# Patient Record
Sex: Female | Born: 1980 | Race: White | Hispanic: No | Marital: Married | Smoking: Former smoker
Health system: Southern US, Community
[De-identification: ages and names within clinical notes are randomized; demographics above are authoritative.]

## PROBLEM LIST (undated history)

## (undated) DIAGNOSIS — Z789 Other specified health status: Secondary | ICD-10-CM

## (undated) HISTORY — PX: TONSILLECTOMY: SUR1361

---
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
## 2002-02-13 ENCOUNTER — Encounter (INDEPENDENT_AMBULATORY_CARE_PROVIDER_SITE_OTHER): Payer: Self-pay | Admitting: *Deleted

## 2002-02-13 ENCOUNTER — Ambulatory Visit (HOSPITAL_BASED_OUTPATIENT_CLINIC_OR_DEPARTMENT_OTHER): Admission: RE | Admit: 2002-02-13 | Discharge: 2002-02-13 | Payer: Self-pay | Admitting: Surgery

## 2003-02-15 ENCOUNTER — Encounter: Payer: Self-pay | Admitting: Obstetrics & Gynecology

## 2003-02-15 ENCOUNTER — Encounter: Admission: RE | Admit: 2003-02-15 | Discharge: 2003-02-15 | Payer: Self-pay | Admitting: Obstetrics & Gynecology

## 2003-08-13 ENCOUNTER — Other Ambulatory Visit: Admission: RE | Admit: 2003-08-13 | Discharge: 2003-08-13 | Payer: Self-pay | Admitting: Obstetrics & Gynecology

## 2004-10-13 ENCOUNTER — Ambulatory Visit: Payer: Self-pay | Admitting: Internal Medicine

## 2005-02-22 ENCOUNTER — Other Ambulatory Visit: Admission: RE | Admit: 2005-02-22 | Discharge: 2005-02-22 | Payer: Self-pay | Admitting: Obstetrics & Gynecology

## 2005-05-21 ENCOUNTER — Ambulatory Visit: Payer: Self-pay | Admitting: Family Medicine

## 2005-05-31 ENCOUNTER — Ambulatory Visit: Payer: Self-pay | Admitting: Internal Medicine

## 2005-08-20 ENCOUNTER — Ambulatory Visit: Payer: Self-pay | Admitting: Internal Medicine

## 2005-09-24 ENCOUNTER — Ambulatory Visit: Payer: Self-pay | Admitting: Internal Medicine

## 2005-12-27 ENCOUNTER — Ambulatory Visit: Payer: Self-pay | Admitting: Internal Medicine

## 2006-08-25 ENCOUNTER — Ambulatory Visit: Payer: Self-pay | Admitting: Internal Medicine

## 2006-09-27 ENCOUNTER — Ambulatory Visit: Payer: Self-pay | Admitting: Internal Medicine

## 2006-10-04 ENCOUNTER — Ambulatory Visit: Payer: Self-pay | Admitting: Internal Medicine

## 2007-01-20 ENCOUNTER — Ambulatory Visit: Payer: Self-pay | Admitting: Internal Medicine

## 2007-01-31 ENCOUNTER — Encounter: Admission: RE | Admit: 2007-01-31 | Discharge: 2007-01-31 | Payer: Self-pay | Admitting: Gastroenterology

## 2007-01-31 IMAGING — RF DG BE W/ CM - WO/W KUB
17 of 22 series · 17 of 22 positions shown · non-contrast
Comparison: none

CLINICAL DATA: Chronic diarrhea. Reflux terminal ileum to evaluate for Crohn
disease.

SINGLE-COLUMN BARIUM ENEMA:
TECHNIQUE: After obtaining a scout radiograph, a single-column barium enema was
performed under fluoroscopy.

[Series 1: run · 1 of 1 slices shown (1 of 15)]
[im 1/1]
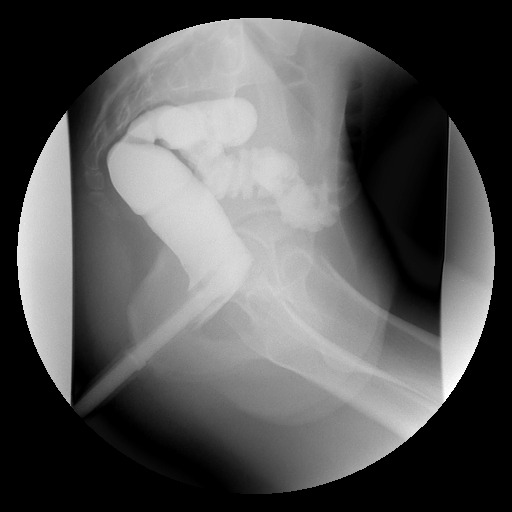

[Series 2: run · 1 of 1 slices shown (2 of 15)]
[im 1/1]
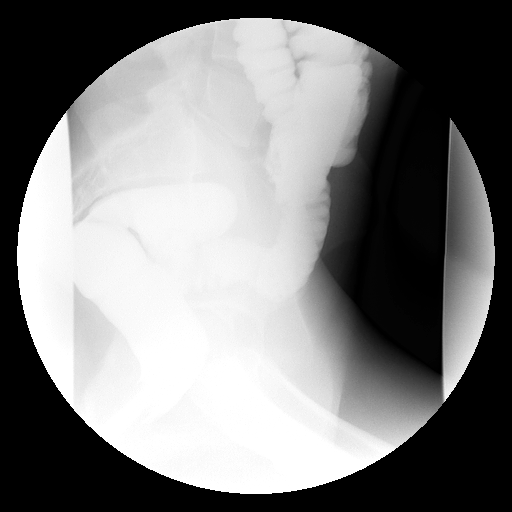

[Series 4: run · 1 of 1 slices shown (3 of 15)]
[im 1/1]
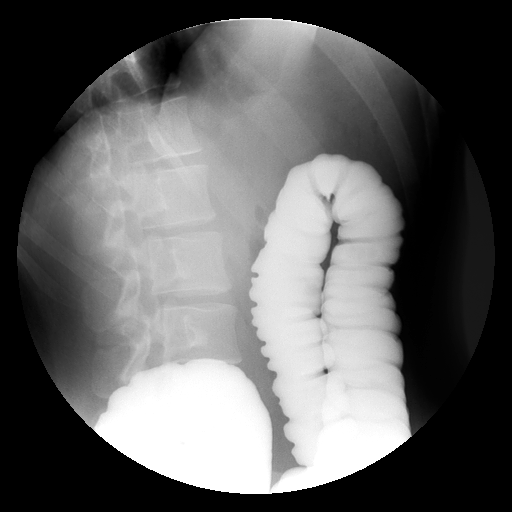

[Series 5: run · 1 of 1 slices shown (4 of 15)]
[im 1/1]
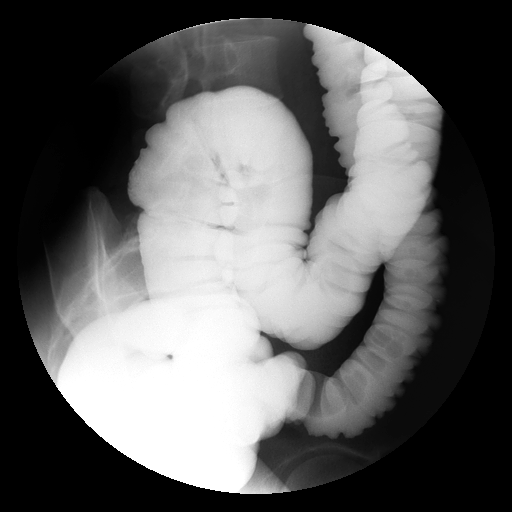

[Series 6: run · 1 of 1 slices shown (5 of 15)]
[im 1/1]
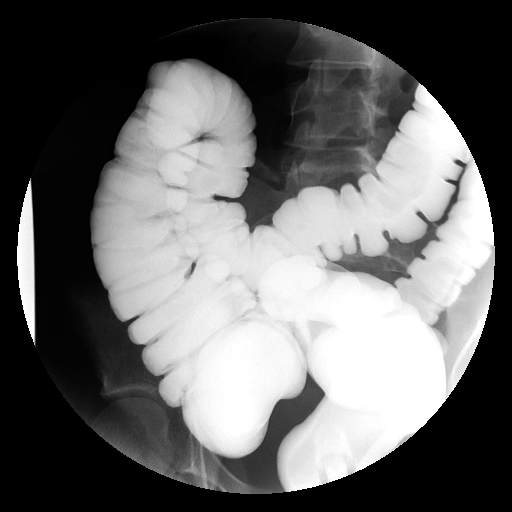

[Series 8: run · 1 of 1 slices shown (6 of 15)]
[im 1/1]
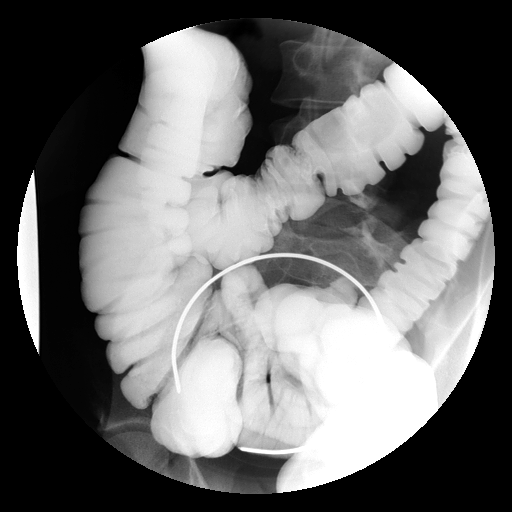

[Series 9: run · 1 of 1 slices shown (7 of 15)]
[im 1/1]
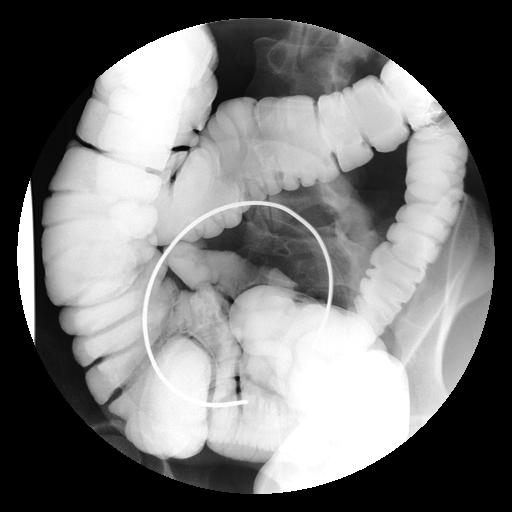

[Series 10: run · 1 of 1 slices shown (8 of 15)]
[im 1/1]
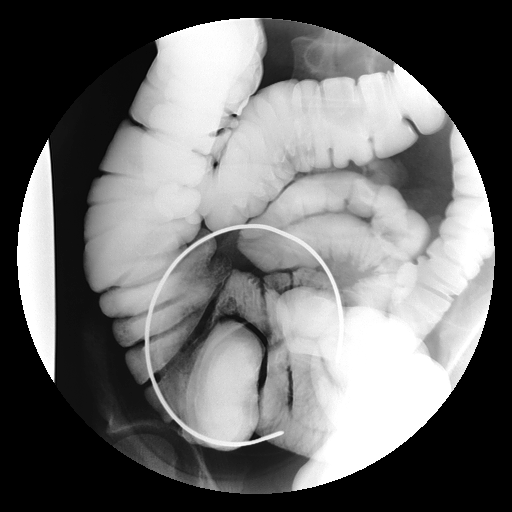

[Series 12: run · 1 of 1 slices shown (9 of 15)]
[im 1/1]
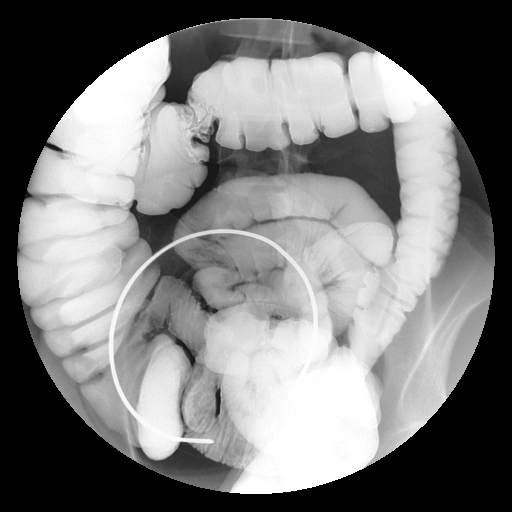

[Series 13: run · 1 of 1 slices shown (10 of 15)]
[im 1/1]
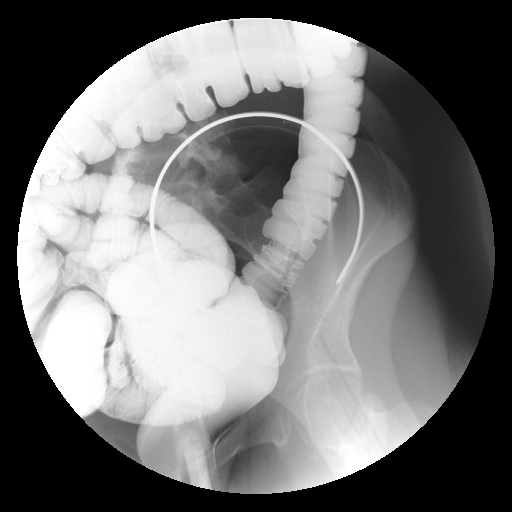

[Series 14: run · 1 of 1 slices shown (11 of 15)]
[im 1/1]
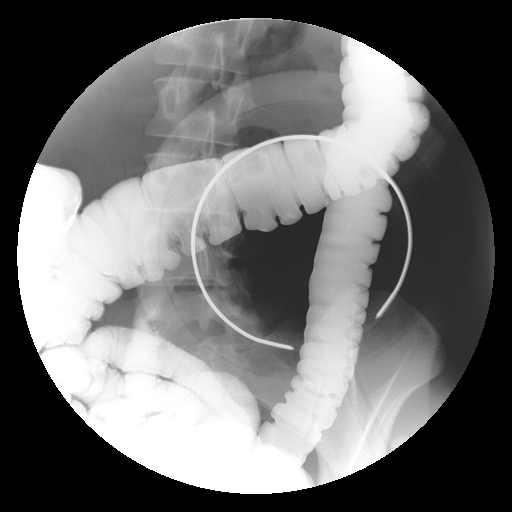

[Series 15: run · 1 of 1 slices shown (12 of 15)]
[im 1/1]
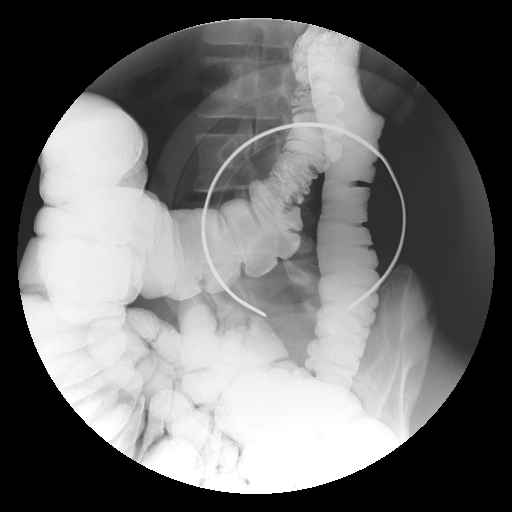

[Series 17: run · 1 of 1 slices shown (13 of 15)]
[im 1/1]
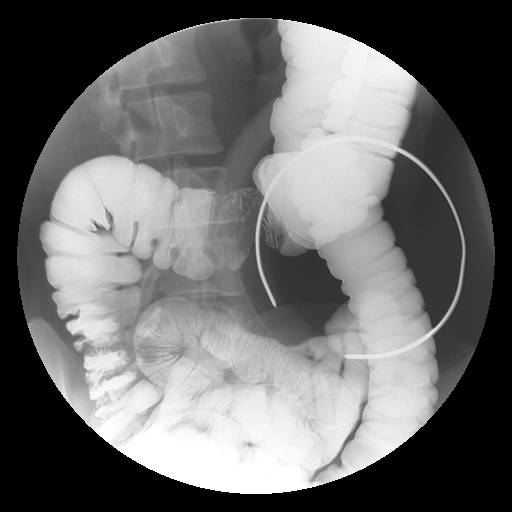

[Series 18: run · 1 of 1 slices shown (14 of 15)]
[im 1/1]
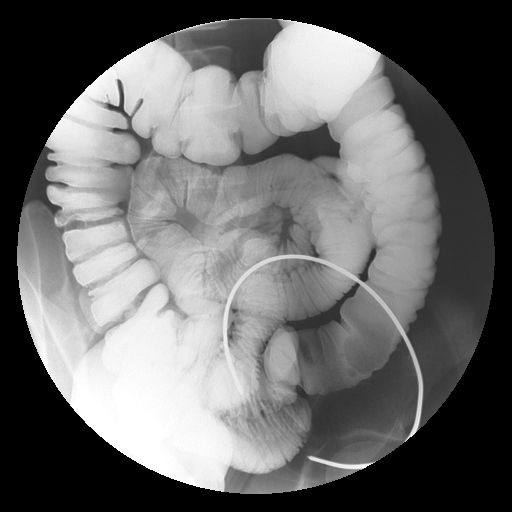

[Series 19: run · 1 of 1 slices shown (15 of 15)]
[im 1/1]
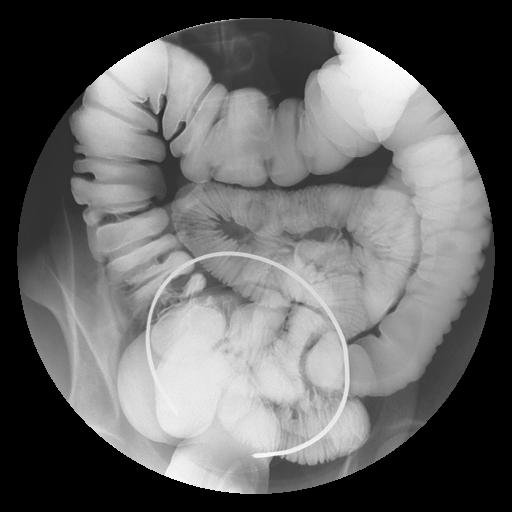

[Series 1001: view not recorded · 0.20mm/px · 1 of 1 slices shown (1 of 2)]
[im 1/1]
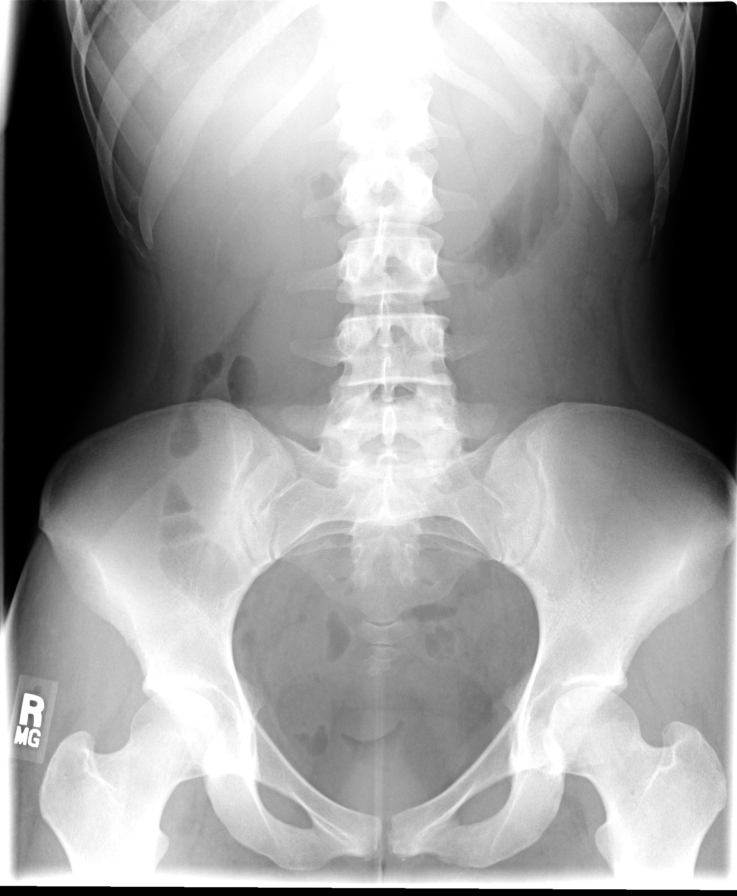

[Series 1002: view not recorded · 0.20mm/px · 1 of 1 slices shown (2 of 2)]
[im 1/1]
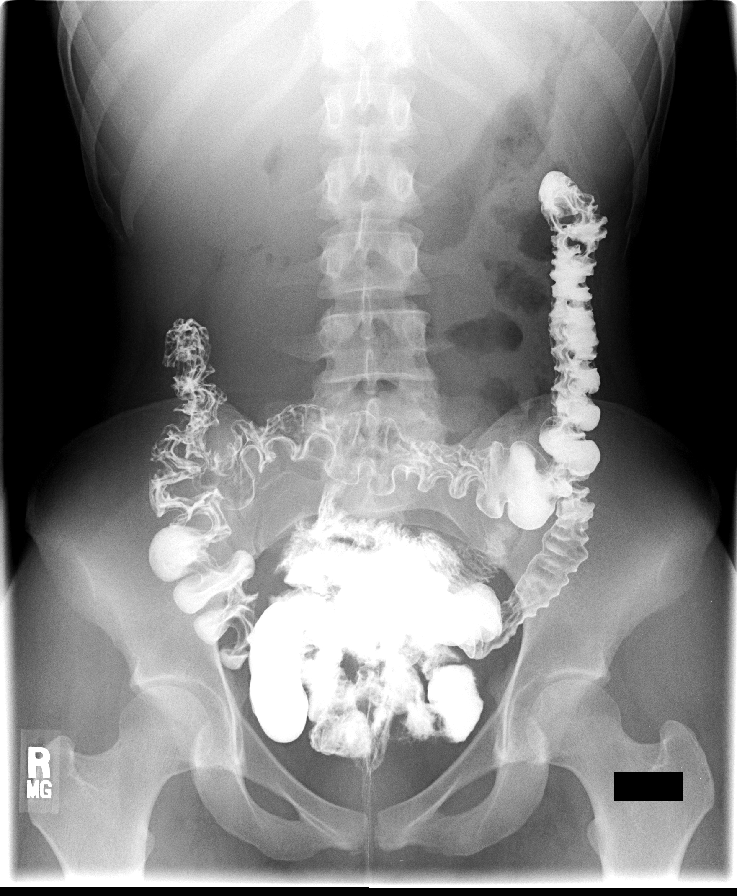

[17 of 22 positions shown; findings below may reference images not displayed]

FINDINGS: The preprocedure scout KUB is normal.

Retrograde filling of the colon with barium reveals no evidence for colonic
stricture. Fairly rapid reflux of barium was observed into terminal ileum. With
initial filling of the terminal ileum, attention was focused on compression of
the distal small bowel to allow adequate visualization of the terminal ileum.
The terminal and distal ileum have a normal appearance. Retrograde filling of
the distal small bowel obscures the sigmoid colon on later images. The cecum,
ascending colon, hepatic flexure, transverse colon, splenic flexure, and
descending colon are normal.
IMPRESSION: Normal appearance of the terminal ileum. No fluoroscopic evidence to suggest
Crohn's disease.

## 2007-06-21 ENCOUNTER — Ambulatory Visit: Payer: Self-pay | Admitting: Internal Medicine

## 2007-06-21 DIAGNOSIS — L0501 Pilonidal cyst with abscess: Secondary | ICD-10-CM | POA: Insufficient documentation

## 2007-06-25 ENCOUNTER — Emergency Department: Payer: Self-pay | Admitting: Emergency Medicine

## 2007-06-26 ENCOUNTER — Telehealth: Payer: Self-pay | Admitting: Internal Medicine

## 2007-06-26 ENCOUNTER — Ambulatory Visit: Payer: Self-pay | Admitting: Internal Medicine

## 2010-07-03 ENCOUNTER — Inpatient Hospital Stay (HOSPITAL_COMMUNITY): Admission: AD | Admit: 2010-07-03 | Discharge: 2010-07-03 | Payer: Self-pay | Admitting: Obstetrics & Gynecology

## 2010-07-03 ENCOUNTER — Ambulatory Visit: Payer: Self-pay | Admitting: Family

## 2011-01-27 ENCOUNTER — Other Ambulatory Visit (HOSPITAL_COMMUNITY): Payer: Self-pay | Admitting: Obstetrics and Gynecology

## 2011-01-27 DIAGNOSIS — Z3689 Encounter for other specified antenatal screening: Secondary | ICD-10-CM

## 2011-02-03 ENCOUNTER — Other Ambulatory Visit (HOSPITAL_COMMUNITY): Payer: Self-pay | Admitting: Obstetrics and Gynecology

## 2011-02-03 ENCOUNTER — Ambulatory Visit (HOSPITAL_COMMUNITY)
Admission: RE | Admit: 2011-02-03 | Discharge: 2011-02-03 | Disposition: A | Discharge status: Home / Self Care | Payer: BC Managed Care – PPO | Source: Ambulatory Visit | Attending: Obstetrics and Gynecology | Admitting: Obstetrics and Gynecology

## 2011-02-03 DIAGNOSIS — O3660X0 Maternal care for excessive fetal growth, unspecified trimester, not applicable or unspecified: Secondary | ICD-10-CM | POA: Insufficient documentation

## 2011-02-03 DIAGNOSIS — O26849 Uterine size-date discrepancy, unspecified trimester: Secondary | ICD-10-CM

## 2011-02-03 DIAGNOSIS — Z3689 Encounter for other specified antenatal screening: Secondary | ICD-10-CM | POA: Insufficient documentation

## 2011-02-03 IMAGING — US US OB COMP +14 WK
1 series · 12 of 28 positions shown · non-contrast
Comparison: none

[Series 1: us ob comp +14 wk · 12 of 51 slices shown]
[im 2/51]
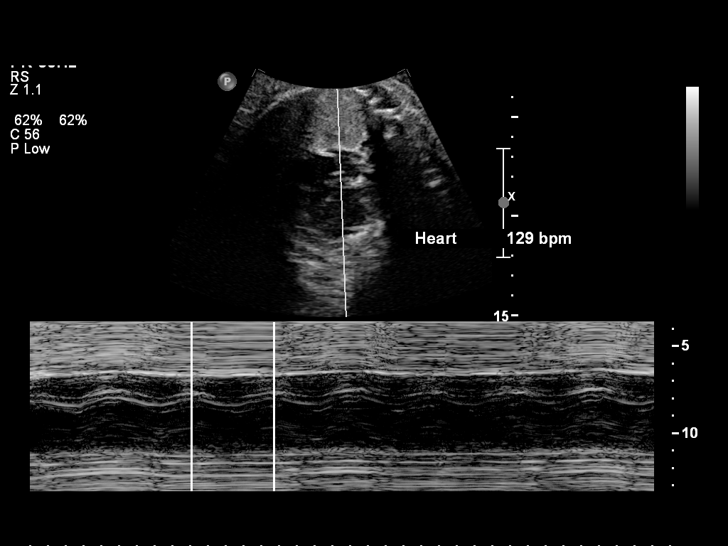
[im 6/51]
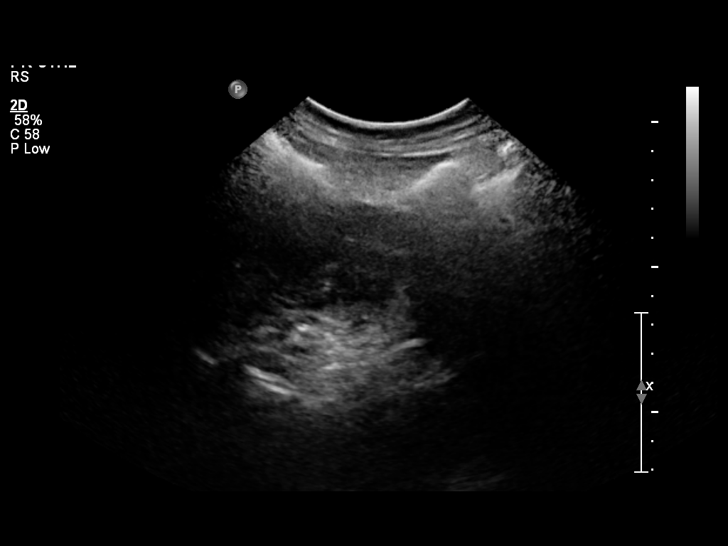
[im 10/51]
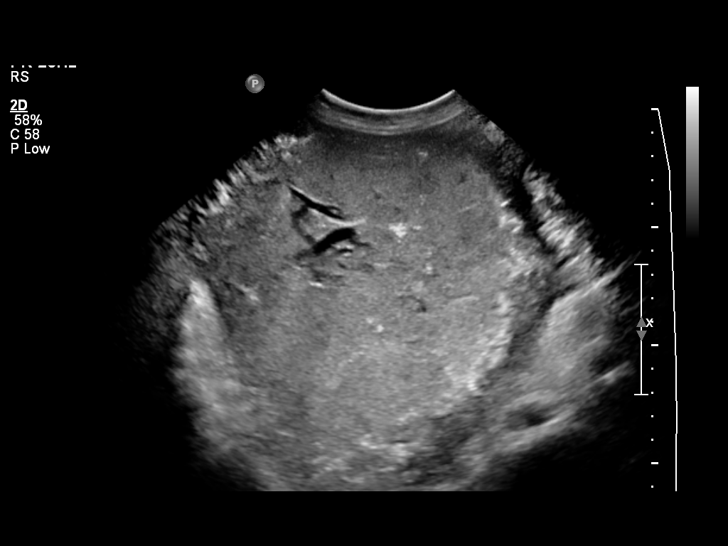
[im 15/51]
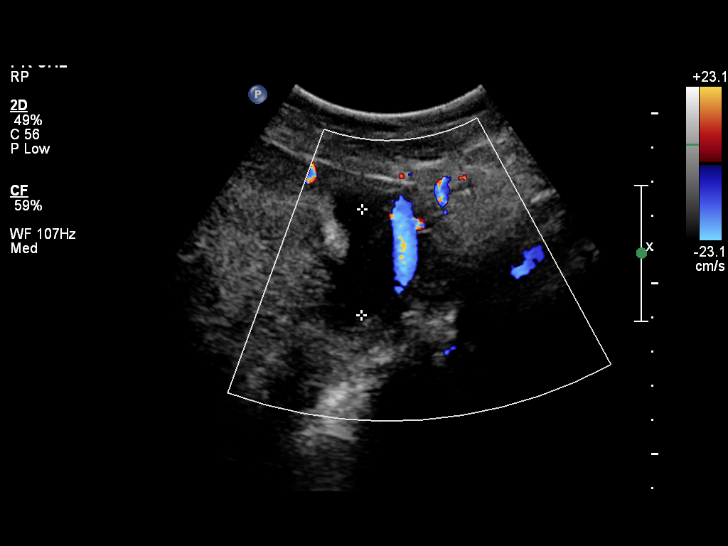
[im 19/51]
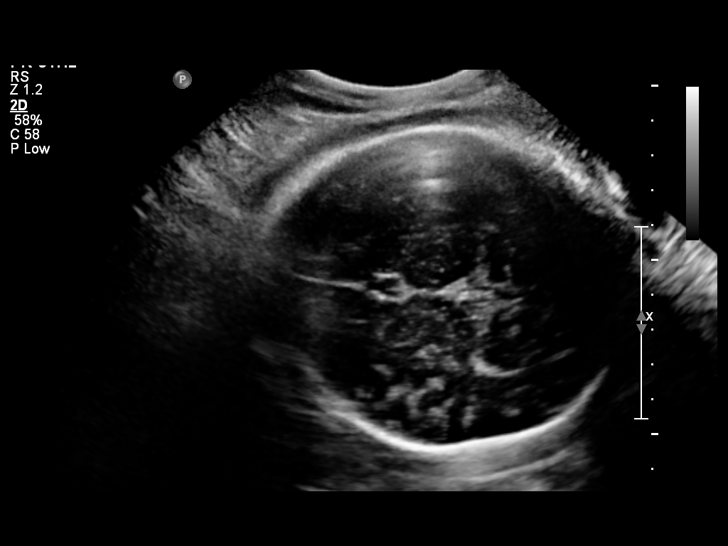
[im 23/51]
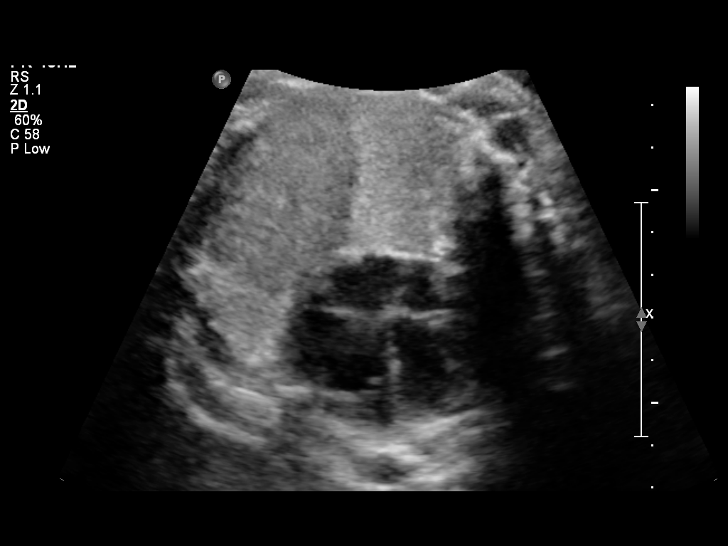
[im 28/51]
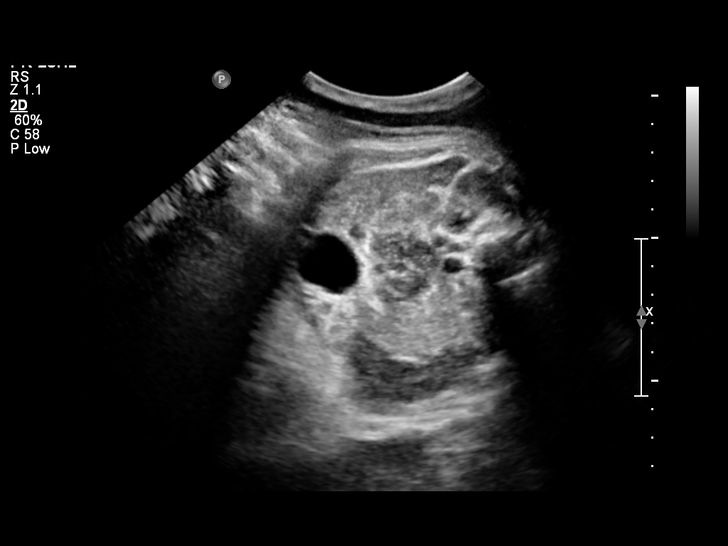
[im 32/51]
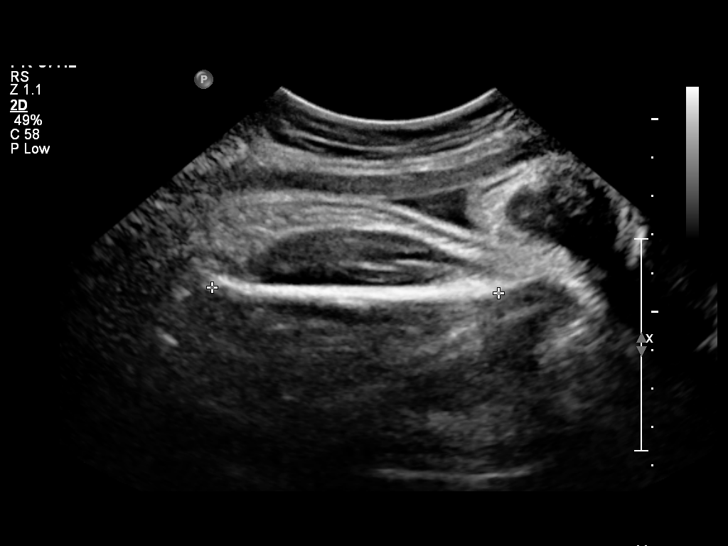
[im 36/51]
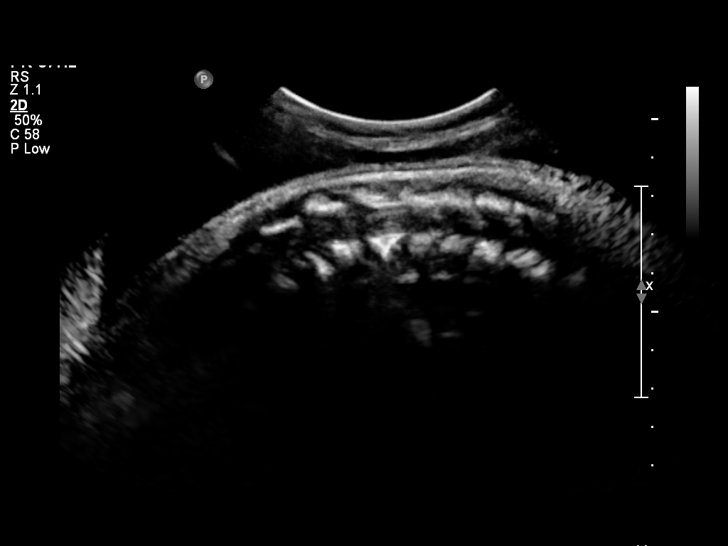
[im 41/51]
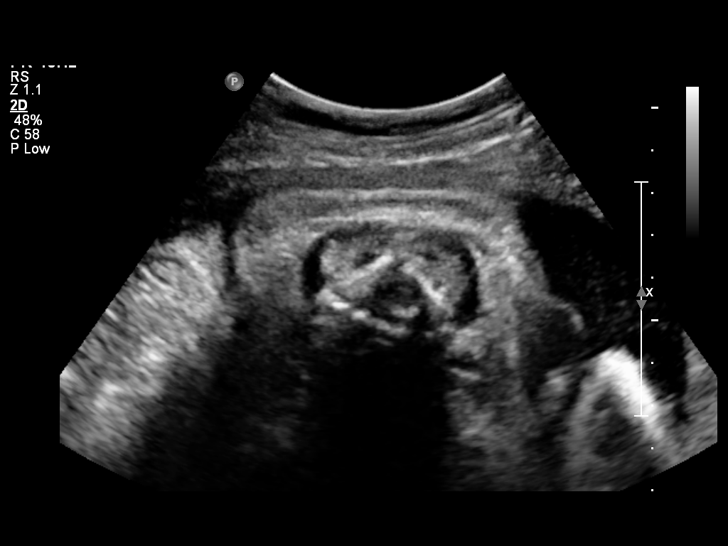
[im 45/51]
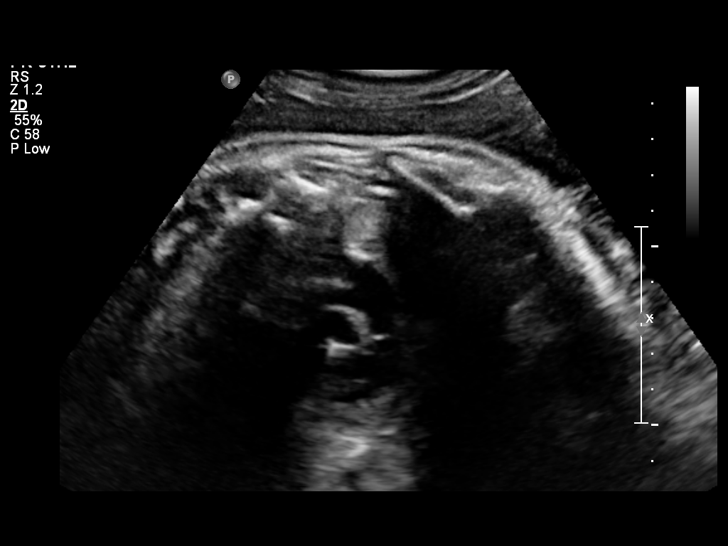
[im 49/51]
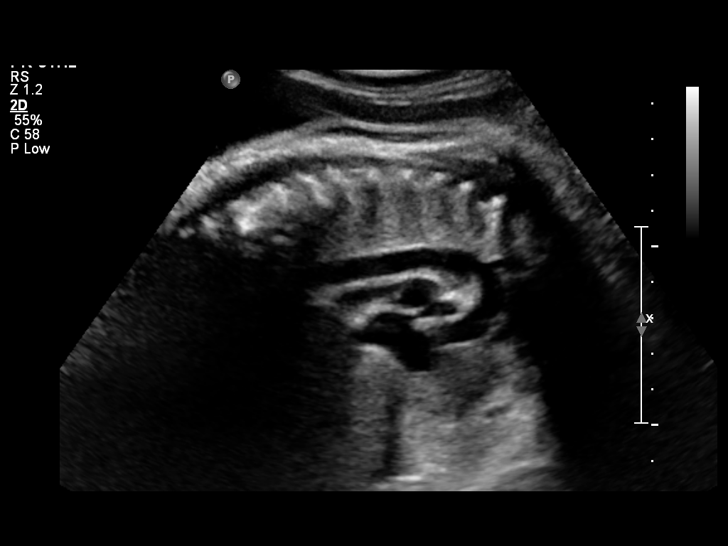

[12 of 28 positions shown; findings below may reference images not displayed]

OBSTETRICS REPORT
                      (Signed Final 02/03/2011 [DATE])

 Order#:         [id number]
Procedures

 US OB COMP + 14 WK                                    76805.1
Indications

 Size greater than dates (Large for gestational [AGE]
 Assess Fetal Growth / Estimated Fetal Weight
 Basic anatomic survey
Fetal Evaluation

 Fetal Heart Rate:  129                          bpm
 Cardiac Activity:  Observed
 Presentation:      Cephalic
 Placenta:          Posterior Fundal
 P. Cord            Not well visualized
 Insertion:

 Amniotic Fluid
 AFI FV:      Subjectively within normal limits
 AFI Sum:     18.22   cm       73  %Tile     Larg Pckt:    8.34  cm
 RUQ:   8.34    cm   RLQ:    2.54   cm    LUQ:   3.1     cm   LLQ:    4.24   cm
Biometry

 BPD:     91.6  mm     G. Age:  37w 1d                CI:        76.06   70 - 86
                                                      FL/HC:      22.1   20.6 -

 HC:     332.9  mm     G. Age:  38w 0d       23  %    HC/AC:      0.91   0.87 -

 AC:     366.8  mm     G. Age:  40w 4d     > 97  %    FL/BPD:     80.5   71 - 87
 FL:      73.7  mm     G. Age:  37w 5d       34  %    FL/AC:      20.1   20 - 24

 Est. FW:    0003  gm      8 lb 4 oz   > 90  %
Gestational Age

 Clinical EDD:  38w 4d                                        EDD:   02/13/11
 U/S Today:     38w 2d                                        EDD:   02/15/11
 Best:          38w 4d     Det. By:  Clinical EDD             EDD:   02/13/11
Anatomy

 Cranium:           Appears normal      Aortic Arch:       Basic anatomy
                                                           exam per order
 Fetal Cavum:       Appears normal      Ductal Arch:       Basic anatomy
                                                           exam per order
 Ventricles:        Appears normal      Diaphragm:         Basic anatomy
                                                           exam per order
 Choroid Plexus:    Appears normal      Stomach:           Appears normal
 Cerebellum:        Previously seen     Abdomen:           Appears normal
 Posterior Fossa:   Previously seen     Abdominal Wall:    Not well
                                                           visualized
 Nuchal Fold:       Not applicable      Cord Vessels:      Appears normal
                    (>20 wks GA)                           (3 vessel cord)
 Face:              Lips appear         Kidneys:           Appear normal
                    normal
 Heart:             Not well            Bladder:           Appears normal
                    visualized
 RVOT:              Basic anatomy       Spine:             Appears normal
                    exam per order
 LVOT:              Basic anatomy       Limbs:             Not well
                    exam per order                         visualized

 Other:     Technically difficult due to advanced GA and fetal
            position.
Cervix Uterus Adnexa

 Cervix:       Not visualized (advanced GA >34 wks)

 Adnexa:     No abnormality visualized.
Impression

 Single live IUP in cephalic presentation.  Concordant dating
 but EFW >90%ile.
 No late-developing anomaly in visualized structures above.

 questions or concerns.

## 2011-02-12 ENCOUNTER — Inpatient Hospital Stay (HOSPITAL_COMMUNITY)
Admission: AD | Admit: 2011-02-12 | Discharge: 2011-02-12 | Disposition: A | Discharge status: Home / Self Care | Payer: BC Managed Care – PPO | Source: Ambulatory Visit | Attending: Obstetrics and Gynecology | Admitting: Obstetrics and Gynecology

## 2011-02-12 ENCOUNTER — Inpatient Hospital Stay (HOSPITAL_COMMUNITY)
Admission: AD | Admit: 2011-02-12 | Discharge: 2011-02-16 | DRG: 371 | Disposition: A | Discharge status: Home / Self Care | Payer: BC Managed Care – PPO | Source: Ambulatory Visit | Attending: Obstetrics and Gynecology | Admitting: Obstetrics and Gynecology

## 2011-02-12 ENCOUNTER — Inpatient Hospital Stay (HOSPITAL_COMMUNITY): Payer: BC Managed Care – PPO

## 2011-02-12 DIAGNOSIS — O339 Maternal care for disproportion, unspecified: Secondary | ICD-10-CM | POA: Diagnosis present

## 2011-02-12 DIAGNOSIS — O324XX0 Maternal care for high head at term, not applicable or unspecified: Secondary | ICD-10-CM | POA: Diagnosis present

## 2011-02-12 DIAGNOSIS — O4290 Premature rupture of membranes, unspecified as to length of time between rupture and onset of labor, unspecified weeks of gestation: Secondary | ICD-10-CM | POA: Diagnosis present

## 2011-02-12 LAB — BASIC METABOLIC PANEL
BUN: 9 mg/dL (ref 6–23)
CO2: 23 mEq/L (ref 19–32)
Calcium: 9.8 mg/dL (ref 8.4–10.5)
Chloride: 102 mEq/L (ref 96–112)
Creatinine, Ser: 0.64 mg/dL (ref 0.4–1.2)

## 2011-02-12 LAB — CBC
HCT: 37.1 % (ref 36.0–46.0)
MCH: 30.4 pg (ref 26.0–34.0)
MCHC: 34 g/dL (ref 30.0–36.0)
MCV: 89.4 fL (ref 78.0–100.0)
RDW: 14.1 % (ref 11.5–15.5)

## 2011-02-12 LAB — URINALYSIS, ROUTINE W REFLEX MICROSCOPIC
Glucose, UA: NEGATIVE mg/dL
Leukocytes, UA: NEGATIVE
Protein, ur: NEGATIVE mg/dL
Specific Gravity, Urine: >1.03 — ABNORMAL HIGH (ref 1.005–1.030)
Urobilinogen, UA: 0.2 mg/dL (ref 0.0–1.0)

## 2011-02-12 LAB — URINE MICROSCOPIC-ADD ON

## 2011-02-14 LAB — CBC
MCH: 29.8 pg (ref 26.0–34.0)
MCHC: 32.8 g/dL (ref 30.0–36.0)
MCV: 91.1 fL (ref 78.0–100.0)
Platelets: 165 10*3/uL (ref 150–400)
RDW: 14.5 % (ref 11.5–15.5)

## 2011-02-17 NOTE — Discharge Summary (Signed)
  Denise [name], Denise [name]       ACCOUNT NO.:  [id number]  MEDICAL RECORD NO.:  [id number]           PATIENT TYPE:  I  LOCATION:  9124                          FACILITY:  [facility]  PHYSICIAN:  Gerrit Friends. Aldona Bar, M.D.   DATE OF BIRTH:  12/08/1980  DATE OF ADMISSION:  02/12/2011 DATE OF DISCHARGE:  02/16/2011                              DISCHARGE SUMMARY   DISCHARGE DIAGNOSES: 1. Term pregnancy delivered 8 pounds female infant, Apgars 8 and 9. 2. Blood type A+.  PROCEDURE:  Cesarean section delivery.  SUMMARY:  This primigravida was admitted at term with ruptured membranes and contractions after an uncomplicated pregnancy.  She progressed but never was able to bring the baby down during the second stage and was taken to the operating room with a preoperative diagnosis of a cephalopelvic disproportion, delivered by primary low-transverse cesarean section with delivery of an 8-pound female infant with Apgars of 8 and 9.  Her postoperative course was totally benign.  Her discharge hemoglobin was 9.8 with a white count of 13,300, platelet count of 165,000.  On the morning of February 16, 2011, she was ambulating well, tolerating a regular diet well, having normal bowel and bladder function, was breast-feeding without difficulty.  Her incision was clean and dry, and she was desirous of discharge.  Her staples were removed and her wound was Steri-Stripped.  She was given all appropriate instructions per discharge brochure and understood all instructions well.  Discharge medications include vitamins - one a day and she will add equivalent Feosol capsules - one daily as well. She was given prescriptions for Motrin 600 mg to use every 6 hours as needed for mild pain or cramping and Tylox 1-2 every 4-6 hours as needed for more severe pain.  As mentioned, she will return to the office for followup in approximately 4 weeks' time or as needed.  CONDITION ON DISCHARGE:  Improved.     Gerrit Friends.  Aldona Bar, M.D.     RMW/MEDQ  D:  02/16/2011  T:  02/16/2011  Job:  [id number]  Electronically Signed by Annamaria Helling M.D. on 02/17/2011 05:55:47 PM

## 2011-03-26 NOTE — Op Note (Signed)
Denise [name], [name]       ACCOUNT NO.:  [id number]  MEDICAL RECORD NO.:  [id number]           PATIENT TYPE:  I  LOCATION:  9124                          FACILITY:  [facility]  PHYSICIAN:  Jolina Symonds H. Tenny Craw, MD     DATE OF BIRTH:  05/08/1981  DATE OF PROCEDURE:  02/13/2011 DATE OF DISCHARGE:                              OPERATIVE REPORT   PREOPERATIVE DIAGNOSES: 1. 40-week intrauterine pregnancy. 2. Failure to descend, cephalopelvic disproportion.  POSTOPERATIVE DIAGNOSES: 1. 40-week intrauterine pregnancy. 2. Failure to descend, cephalopelvic disproportion.  PROCEDURE:  Primary low transverse cesarean section via Pfannenstiel skin incision.  SURGEON:  Freddrick March. Tenny Craw, MD  ASSISTANT:  None.  ANESTHESIA:  Epidural and 13 mL of 0.5% Marcaine local.  OPERATIVE FINDINGS:  Vigorous female infant in vertex presentation, slightly asynclitic weighing 8 pounds 0 ounces with Apgar scores of 8 at 1 minute and 9 at 5 minutes.  Normal appearing ovaries, tubes, and uterus.  The specimen placenta for L and D for disposal.  ESTIMATED BLOOD LOSS:  700 mL.  URINE OUTPUT:  200 mL.  IV FLUIDS:  2000 mL.  COMPLICATIONS:  None.  The patient transferred to PACU in stable condition following the procedure.  PROCEDURE IN DETAIL:  [name] presented in labor on February 12, 2011.  She progressed to complete and pushing, pushed for approximately 2 hours with minimal descent of the fetal head concerning for cephalopelvic disproportion.  Options continued pushing for another hour versus proceeding with cesarean section were discussed and the patient elected to proceed with C-section.  Following the appropriate informed consent, the patient was brought to the operating room where epidural anesthesia was administered and found to be adequate.  The patient was placed in the dorsal supine position with leftward tilt, prepped, and draped in a normal sterile fashion.  Scalpel was then used to  make a Pfannenstiel skin incision was carried down through the underlying layers of soft tissue to the fascia.  The fascia was incised in the midline.  Fascial incision was extended laterally with Mayo scissors. Superior aspect of the fascial incision was tented up.  The underlying rectus muscle was dissected off sharply with the electrocautery unit. Same procedure was repeated on the inferior aspect of the fascial incision.  The abdominal peritoneum was identified after the rectus muscles were separated and tented up, entered sharply.  The incision was then extended superiorly inferiorly with good visualization of the bladder.  The Alexis retractor was then inserted.  The vesicouterine peritoneum was identified, tented up, and entered sharply.  The incision was extended laterally and the flap bladder flap was created with blunt dissection digitally.  The scalpel was then used to make a low transverse incision on the uterus which was extended laterally with blunt dissection.  The fetal vertex was identified, brought up through the uterine incision, and the infant's head was delivered atraumatically.  A nuchal cord x1 was reduced.  The body was then delivered and cried vigorously on the operative field.  The cord was clamped and cut.  Infant was passed to the awaiting pediatricians. Placenta was then was spontaneously delivered, uterus was cleared of all clot and  debris.  Uterine incision was repaired with #1 chromic in a running locked fashion, followed by second imbricating layer.  Uterine incision was hemostatic.  Ovaries and tubes were normal.  Uterus was turned to the abdominal cavity.  The abdominal peritoneum was reapproximated with 2-0 Vicryl.  Rectus muscles were reapproximated with 2-0 chromic.  Fascia was closed with 0 looped PDS.  Skin was closed with staples.  All sponge, lap, needle counts were correct x2.  The patient tolerated the procedure well and was brought to the  recovery room in stable condition following the procedure.     Freddrick March. Tenny Craw, MD     KHR/MEDQ  D:  02/13/2011  T:  02/14/2011  Job:  [id number]  Electronically Signed by Waynard Reeds MD on 03/26/2011 10:45:30 AM

## 2011-04-13 NOTE — Assessment & Plan Note (Signed)
[facility]                                 ON-CALL NOTE   TANAZIA, ACHEE                       MRN:          [id number]  DATE:06/25/2007                            DOB:          June 16, 1981    TIME OF CALL:  9:39am.   CALLER:  Mother of patient.   PRIMARY CARE PHYSICIAN:  Dr. Alphonsus Sias.   TELEPHONE NUMBER:  [phone]   The patient has a history of a pilonidal cyst that flared up on a couple  of occasions. Now for the past three weeks, she has been struggling with  this cyst which seems to be getting larger and getting worse. She saw  Dr. Alphonsus Sias this past week and was placed on Clindamycin, however she  continues to get worse with now severe pain at the site of the cyst and  she has fevers up to 101 degrees. My advice is to take her to the  emergency room now and that she possibly may need surgical attention.     Tera Mater. Clent Ridges, MD  Electronically Signed    SAF/MedQ  DD: 06/25/2007  DT: 06/26/2007  Job #: [id number]

## 2011-04-16 NOTE — Op Note (Signed)
[place]  Patient:    Denise [name], Denise [name] Visit Number: [id number] MRN: [id number]          Service Type: DSU Location: [facility] Attending Physician:  Charlton Haws Dictated by:   Currie Paris, M.D. Proc. Date: 02/13/02 Admit Date:  02/13/2002   CC:         Gerlene Burdock D. Arlyce Dice, M.D.  Cheral Marker, M.D.   Operative Report  CCS# [id number]  PREOPERATIVE DIAGNOSIS:  Right breast fibroadenoma.  POSTOPERATIVE DIAGNOSIS:  Right breast fibroadenoma.  OPERATION PERFORMED:  Removal of right breast mass.  SURGEON:  Currie Paris, M.D.  ANESTHESIA:  MAC.  INDICATIONS FOR PROCEDURE:  The patient is a 30 year old woman with bilateral breast masses, both of which underwent core biopsy.  The left was confirmed as a fibroadenoma and the right one just showed some fibrosis and I did not have enough tissue to make the diagnosis of fibroadenoma.  After discussion with the patient she elected to have this excised rather than repeat biopsy.  DESCRIPTION OF PROCEDURE:  The patient was seen in the holding area and had no further questions.  She was taken to the operating room and the mass was identified by palpation and marked.  She was then given IV sedation.  The breast was prepped and draped.  1% Xylocaine was infiltrated over the mass and a short transverse incision directly over it made.  The mass was just barely into the upper outer quadrant.  The subcutaneous tissues were divided with the cautery and I could identify the mass both by palpation and visualization and it appeared to be a fibroadenoma.  A 3-0 Vicryl suture was placed through this as a holding suture for traction and then using cutting current of the cautery, the mass excised with a little rim of normal breast tissue around it except superficially where this was really right into the subcutaneous tissues and there was no breast tissue on top of it.  This was sent to  pathology.  The wound was closed in layers with some 3-0 Vicryl followed by 4-0 Monocryl subcuticular plus Steri-Strips.  The patient tolerated the procedure well. There were no operative complications.  All counts were correct. Dictated by:   Currie Paris, M.D. Attending Physician:  Charlton Haws DD:  02/13/02 TD:  02/14/02 Job: [id number]

## 2011-06-10 ENCOUNTER — Inpatient Hospital Stay (HOSPITAL_COMMUNITY): Admission: AD | Admit: 2011-06-10 | Payer: Self-pay | Source: Home / Self Care | Admitting: Obstetrics & Gynecology

## 2013-10-05 LAB — OB RESULTS CONSOLE HIV ANTIBODY (ROUTINE TESTING): HIV: NONREACTIVE

## 2013-10-05 LAB — OB RESULTS CONSOLE RUBELLA ANTIBODY, IGM: RUBELLA: IMMUNE

## 2013-10-05 LAB — OB RESULTS CONSOLE ABO/RH: RH Type: POSITIVE

## 2013-10-05 LAB — OB RESULTS CONSOLE HEPATITIS B SURFACE ANTIGEN: HEP B S AG: NEGATIVE

## 2013-10-05 LAB — OB RESULTS CONSOLE RPR: RPR: NONREACTIVE

## 2013-10-05 LAB — OB RESULTS CONSOLE ANTIBODY SCREEN: Antibody Screen: NEGATIVE

## 2014-05-15 ENCOUNTER — Encounter (HOSPITAL_COMMUNITY): Payer: Self-pay

## 2014-05-15 ENCOUNTER — Encounter (HOSPITAL_COMMUNITY)
Admission: RE | Admit: 2014-05-15 | Discharge: 2014-05-15 | Disposition: A | Discharge status: Home / Self Care | Payer: Managed Care, Other (non HMO) | Source: Ambulatory Visit | Attending: Obstetrics and Gynecology | Admitting: Obstetrics and Gynecology

## 2014-05-15 ENCOUNTER — Encounter (HOSPITAL_COMMUNITY): Payer: Self-pay | Admitting: Pharmacy Technician

## 2014-05-15 HISTORY — DX: Other specified health status: Z78.9

## 2014-05-15 LAB — CBC
HCT: 35.3 % — ABNORMAL LOW (ref 36.0–46.0)
HEMOGLOBIN: 11.5 g/dL — AB (ref 12.0–15.0)
MCH: 26.8 pg (ref 26.0–34.0)
MCHC: 32.6 g/dL (ref 30.0–36.0)
MCV: 82.3 fL (ref 78.0–100.0)
Platelets: 191 10*3/uL (ref 150–400)
RBC: 4.29 MIL/uL (ref 3.87–5.11)
RDW: 18.7 % — ABNORMAL HIGH (ref 11.5–15.5)
WBC: 13 10*3/uL — AB (ref 4.0–10.5)

## 2014-05-15 LAB — TYPE AND SCREEN
ABO/RH(D): A POS
Antibody Screen: NEGATIVE

## 2014-05-15 LAB — ABO/RH: ABO/RH(D): A POS

## 2014-05-15 NOTE — Patient Instructions (Signed)
20 Denise BranchRachel M [name]  05/15/2014   Your procedure is scheduled on:  05/17/14  [facility] 1030 AM.  Pick up the phone at the desk and dial [phone].   Call this number if you have problems the morning of surgery: [phone]   Remember:   Do not eat food:After Midnight.  Do not drink clear liquids: 4 Hours before arrival.  Take these medicines the morning of surgery with A SIP OF WATER: NA   Do not wear jewelry, make-up or nail polish.  Do not wear lotions, powders, or perfumes. You may wear deodorant.  Do not shave 48 hours prior to surgery.  Do not bring valuables to the hospital.  [facility] is not   responsible for any belongings or valuables brought to the hospital.  Contacts, dentures or bridgework may not be worn into surgery.  Leave suitcase in the car. After surgery it may be brought to your room.  For patients admitted to the hospital, checkout time is 11:00 AM the day of              discharge.   Patients discharged the day of surgery will not be allowed to drive             home.  Name and phone number of your driver: NA  Special Instructions:      Please read over the following fact sheets that you were given:   Surgical Site Infection Prevention

## 2014-05-16 LAB — RPR

## 2014-05-17 ENCOUNTER — Encounter (HOSPITAL_COMMUNITY): Payer: Managed Care, Other (non HMO) | Admitting: Anesthesiology

## 2014-05-17 ENCOUNTER — Inpatient Hospital Stay (HOSPITAL_COMMUNITY)
Admission: RE | Admit: 2014-05-17 | Discharge: 2014-05-19 | DRG: 766 | Disposition: A | Discharge status: Home / Self Care | Payer: Managed Care, Other (non HMO) | Source: Ambulatory Visit | Attending: Obstetrics and Gynecology | Admitting: Obstetrics and Gynecology

## 2014-05-17 ENCOUNTER — Inpatient Hospital Stay (HOSPITAL_COMMUNITY): Payer: Managed Care, Other (non HMO) | Admitting: Anesthesiology

## 2014-05-17 ENCOUNTER — Encounter (HOSPITAL_COMMUNITY): Payer: Self-pay

## 2014-05-17 ENCOUNTER — Encounter (HOSPITAL_COMMUNITY)
Admission: RE | Disposition: A | Discharge status: Home / Self Care | Payer: Self-pay | Source: Ambulatory Visit | Attending: Obstetrics and Gynecology

## 2014-05-17 DIAGNOSIS — O34219 Maternal care for unspecified type scar from previous cesarean delivery: Principal | ICD-10-CM | POA: Diagnosis present

## 2014-05-17 DIAGNOSIS — Z87891 Personal history of nicotine dependence: Secondary | ICD-10-CM

## 2014-05-17 SURGERY — Surgical Case
Anesthesia: Spinal

## 2014-05-17 MED ORDER — PRENATAL MULTIVITAMIN CH
1.0000 | ORAL_TABLET | Freq: Every day | ORAL | Status: DC
  Administered 2014-05-18: 1 via ORAL
  Filled 2014-05-17: qty 1

## 2014-05-17 MED ORDER — OXYCODONE-ACETAMINOPHEN 5-325 MG PO TABS
1.0000 | ORAL_TABLET | ORAL | Status: DC | PRN
  Administered 2014-05-18 – 2014-05-19 (×3): 1 via ORAL
  Filled 2014-05-17 (×3): qty 1

## 2014-05-17 MED ORDER — SODIUM BICARBONATE 8.4 % IV SOLN
INTRAVENOUS | Status: AC
  Filled 2014-05-17: qty 50

## 2014-05-17 MED ORDER — KETOROLAC TROMETHAMINE 30 MG/ML IJ SOLN
INTRAMUSCULAR | Status: AC
  Administered 2014-05-17: 30 mg via INTRAVENOUS
  Filled 2014-05-17: qty 1

## 2014-05-17 MED ORDER — OXYTOCIN 40 UNITS IN LACTATED RINGERS INFUSION - SIMPLE MED: 62.5000 mL/h | INTRAVENOUS | Status: AC

## 2014-05-17 MED ORDER — MENTHOL 3 MG MT LOZG: 1.0000 | LOZENGE | OROMUCOSAL | Status: DC | PRN

## 2014-05-17 MED ORDER — SCOPOLAMINE 1 MG/3DAYS TD PT72
MEDICATED_PATCH | TRANSDERMAL | Status: AC
  Administered 2014-05-17: 1.5 mg via TRANSDERMAL
  Filled 2014-05-17: qty 1

## 2014-05-17 MED ORDER — HYDROMORPHONE HCL PF 1 MG/ML IJ SOLN: 0.2500 mg | INTRAMUSCULAR | Status: DC | PRN

## 2014-05-17 MED ORDER — MEPERIDINE HCL 25 MG/ML IJ SOLN: 6.2500 mg | INTRAMUSCULAR | Status: DC | PRN

## 2014-05-17 MED ORDER — KETOROLAC TROMETHAMINE 30 MG/ML IJ SOLN
30.0000 mg | Freq: Four times a day (QID) | INTRAMUSCULAR | Status: AC | PRN
  Administered 2014-05-17: 30 mg via INTRAVENOUS

## 2014-05-17 MED ORDER — MORPHINE SULFATE (PF) 0.5 MG/ML IJ SOLN
INTRAMUSCULAR | Status: DC | PRN
  Administered 2014-05-17: .1 mg via INTRATHECAL

## 2014-05-17 MED ORDER — ONDANSETRON HCL 4 MG PO TABS: 4.0000 mg | ORAL_TABLET | ORAL | Status: DC | PRN

## 2014-05-17 MED ORDER — WITCH HAZEL-GLYCERIN EX PADS: 1.0000 "application " | MEDICATED_PAD | CUTANEOUS | Status: DC | PRN

## 2014-05-17 MED ORDER — SCOPOLAMINE 1 MG/3DAYS TD PT72
1.0000 | MEDICATED_PATCH | Freq: Once | TRANSDERMAL | Status: DC
  Administered 2014-05-17: 1.5 mg via TRANSDERMAL

## 2014-05-17 MED ORDER — FENTANYL CITRATE 0.05 MG/ML IJ SOLN
INTRAMUSCULAR | Status: DC | PRN
  Administered 2014-05-17: 15 ug via INTRATHECAL

## 2014-05-17 MED ORDER — OXYTOCIN 10 UNIT/ML IJ SOLN
INTRAMUSCULAR | Status: AC
  Filled 2014-05-17: qty 4

## 2014-05-17 MED ORDER — BUPIVACAINE IN DEXTROSE 0.75-8.25 % IT SOLN
INTRATHECAL | Status: DC | PRN
  Administered 2014-05-17: 1.2 mL via INTRATHECAL

## 2014-05-17 MED ORDER — PHENYLEPHRINE 8 MG IN D5W 100 ML (0.08MG/ML) PREMIX OPTIME
INJECTION | INTRAVENOUS | Status: DC | PRN
  Administered 2014-05-17: 60 ug/min via INTRAVENOUS

## 2014-05-17 MED ORDER — PHENYLEPHRINE 40 MCG/ML (10ML) SYRINGE FOR IV PUSH (FOR BLOOD PRESSURE SUPPORT)
PREFILLED_SYRINGE | INTRAVENOUS | Status: AC
  Filled 2014-05-17: qty 5

## 2014-05-17 MED ORDER — DIPHENHYDRAMINE HCL 50 MG/ML IJ SOLN: 25.0000 mg | INTRAMUSCULAR | Status: DC | PRN

## 2014-05-17 MED ORDER — GENTAMICIN SULFATE 40 MG/ML IJ SOLN
INTRAVENOUS | Status: AC
  Administered 2014-05-17: 113.25 mL via INTRAVENOUS
  Filled 2014-05-17: qty 7.25

## 2014-05-17 MED ORDER — LACTATED RINGERS IV SOLN
Freq: Once | INTRAVENOUS | Status: AC
  Administered 2014-05-17: 11:00:00 via INTRAVENOUS

## 2014-05-17 MED ORDER — NALOXONE HCL 1 MG/ML IJ SOLN: 1.0000 ug/kg/h | INTRAVENOUS | Status: DC | PRN

## 2014-05-17 MED ORDER — SODIUM CHLORIDE 0.9 % IJ SOLN: 3.0000 mL | INTRAMUSCULAR | Status: DC | PRN

## 2014-05-17 MED ORDER — METOCLOPRAMIDE HCL 5 MG/ML IJ SOLN: 10.0000 mg | Freq: Three times a day (TID) | INTRAMUSCULAR | Status: DC | PRN

## 2014-05-17 MED ORDER — DIPHENHYDRAMINE HCL 25 MG PO CAPS: 25.0000 mg | ORAL_CAPSULE | Freq: Four times a day (QID) | ORAL | Status: DC | PRN

## 2014-05-17 MED ORDER — LANOLIN HYDROUS EX OINT: 1.0000 "application " | TOPICAL_OINTMENT | CUTANEOUS | Status: DC | PRN

## 2014-05-17 MED ORDER — LIDOCAINE-EPINEPHRINE (PF) 2 %-1:200000 IJ SOLN
INTRAMUSCULAR | Status: AC
  Filled 2014-05-17: qty 20

## 2014-05-17 MED ORDER — ONDANSETRON HCL 4 MG/2ML IJ SOLN
INTRAMUSCULAR | Status: AC
  Filled 2014-05-17: qty 2

## 2014-05-17 MED ORDER — ZOLPIDEM TARTRATE 5 MG PO TABS: 5.0000 mg | ORAL_TABLET | Freq: Every evening | ORAL | Status: DC | PRN

## 2014-05-17 MED ORDER — 0.9 % SODIUM CHLORIDE (POUR BTL) OPTIME
TOPICAL | Status: DC | PRN
  Administered 2014-05-17: 1000 mL

## 2014-05-17 MED ORDER — LACTATED RINGERS IV SOLN
INTRAVENOUS | Status: DC
  Administered 2014-05-17: 21:00:00 via INTRAVENOUS

## 2014-05-17 MED ORDER — NALBUPHINE HCL 10 MG/ML IJ SOLN: 5.0000 mg | INTRAMUSCULAR | Status: DC | PRN

## 2014-05-17 MED ORDER — PHENYLEPHRINE HCL 10 MG/ML IJ SOLN
INTRAMUSCULAR | Status: DC | PRN
  Administered 2014-05-17: 60 ug via INTRAVENOUS

## 2014-05-17 MED ORDER — LACTATED RINGERS IV SOLN
INTRAVENOUS | Status: DC
  Administered 2014-05-17 (×3): via INTRAVENOUS

## 2014-05-17 MED ORDER — IBUPROFEN 600 MG PO TABS
600.0000 mg | ORAL_TABLET | Freq: Four times a day (QID) | ORAL | Status: DC
  Administered 2014-05-17 – 2014-05-19 (×6): 600 mg via ORAL
  Filled 2014-05-17 (×6): qty 1

## 2014-05-17 MED ORDER — PHENYLEPHRINE 8 MG IN D5W 100 ML (0.08MG/ML) PREMIX OPTIME
INJECTION | INTRAVENOUS | Status: AC
  Filled 2014-05-17: qty 100

## 2014-05-17 MED ORDER — MORPHINE SULFATE 0.5 MG/ML IJ SOLN
INTRAMUSCULAR | Status: AC
  Filled 2014-05-17: qty 10

## 2014-05-17 MED ORDER — SENNOSIDES-DOCUSATE SODIUM 8.6-50 MG PO TABS
2.0000 | ORAL_TABLET | ORAL | Status: DC
  Administered 2014-05-17 – 2014-05-19 (×2): 2 via ORAL
  Filled 2014-05-17 (×2): qty 2

## 2014-05-17 MED ORDER — KETOROLAC TROMETHAMINE 30 MG/ML IJ SOLN: 30.0000 mg | Freq: Four times a day (QID) | INTRAMUSCULAR | Status: AC | PRN

## 2014-05-17 MED ORDER — TETANUS-DIPHTH-ACELL PERTUSSIS 5-2.5-18.5 LF-MCG/0.5 IM SUSP: 0.5000 mL | Freq: Once | INTRAMUSCULAR | Status: DC

## 2014-05-17 MED ORDER — NALBUPHINE HCL 10 MG/ML IJ SOLN
5.0000 mg | INTRAMUSCULAR | Status: DC | PRN
  Administered 2014-05-17 (×2): 10 mg via INTRAVENOUS
  Filled 2014-05-17 (×2): qty 1

## 2014-05-17 MED ORDER — ONDANSETRON HCL 4 MG/2ML IJ SOLN: 4.0000 mg | Freq: Three times a day (TID) | INTRAMUSCULAR | Status: DC | PRN

## 2014-05-17 MED ORDER — FENTANYL CITRATE 0.05 MG/ML IJ SOLN
INTRAMUSCULAR | Status: AC
  Filled 2014-05-17: qty 2

## 2014-05-17 MED ORDER — SIMETHICONE 80 MG PO CHEW: 80.0000 mg | CHEWABLE_TABLET | ORAL | Status: DC | PRN

## 2014-05-17 MED ORDER — DIBUCAINE 1 % RE OINT: 1.0000 "application " | TOPICAL_OINTMENT | RECTAL | Status: DC | PRN

## 2014-05-17 MED ORDER — SCOPOLAMINE 1 MG/3DAYS TD PT72: 1.0000 | MEDICATED_PATCH | Freq: Once | TRANSDERMAL | Status: DC

## 2014-05-17 MED ORDER — NALOXONE HCL 0.4 MG/ML IJ SOLN: 0.4000 mg | INTRAMUSCULAR | Status: DC | PRN

## 2014-05-17 MED ORDER — ONDANSETRON HCL 4 MG/2ML IJ SOLN: 4.0000 mg | INTRAMUSCULAR | Status: DC | PRN

## 2014-05-17 MED ORDER — SIMETHICONE 80 MG PO CHEW
80.0000 mg | CHEWABLE_TABLET | Freq: Three times a day (TID) | ORAL | Status: DC
  Administered 2014-05-18 – 2014-05-19 (×4): 80 mg via ORAL
  Filled 2014-05-17 (×4): qty 1

## 2014-05-17 MED ORDER — DIPHENHYDRAMINE HCL 25 MG PO CAPS: 25.0000 mg | ORAL_CAPSULE | ORAL | Status: DC | PRN

## 2014-05-17 MED ORDER — SIMETHICONE 80 MG PO CHEW
80.0000 mg | CHEWABLE_TABLET | ORAL | Status: DC
  Administered 2014-05-17 – 2014-05-19 (×2): 80 mg via ORAL
  Filled 2014-05-17 (×2): qty 1

## 2014-05-17 MED ORDER — DIPHENHYDRAMINE HCL 50 MG/ML IJ SOLN
12.5000 mg | INTRAMUSCULAR | Status: DC | PRN
  Administered 2014-05-18: 12.5 mg via INTRAVENOUS
  Filled 2014-05-17: qty 1

## 2014-05-17 SURGICAL SUPPLY — 34 items
ADH SKN CLS APL DERMABOND .7 (GAUZE/BANDAGES/DRESSINGS) ×1
ADH SKN CLS LQ APL DERMABOND (GAUZE/BANDAGES/DRESSINGS) ×1
CLAMP CORD UMBIL (MISCELLANEOUS) IMPLANT
CLOTH BEACON ORANGE TIMEOUT ST (SAFETY) ×3 IMPLANT
DERMABOND ADHESIVE PROPEN (GAUZE/BANDAGES/DRESSINGS) ×2
DERMABOND ADVANCED (GAUZE/BANDAGES/DRESSINGS) ×2
DERMABOND ADVANCED .7 DNX12 (GAUZE/BANDAGES/DRESSINGS) IMPLANT
DERMABOND ADVANCED .7 DNX6 (GAUZE/BANDAGES/DRESSINGS) IMPLANT
DRAPE LG THREE QUARTER DISP (DRAPES) IMPLANT
DRSG OPSITE POSTOP 4X10 (GAUZE/BANDAGES/DRESSINGS) ×3 IMPLANT
DURAPREP 26ML APPLICATOR (WOUND CARE) ×3 IMPLANT
ELECT REM PT RETURN 9FT ADLT (ELECTROSURGICAL) ×3
ELECTRODE REM PT RTRN 9FT ADLT (ELECTROSURGICAL) ×1 IMPLANT
EXTRACTOR VACUUM M CUP 4 TUBE (SUCTIONS) ×1 IMPLANT
EXTRACTOR VACUUM M CUP 4' TUBE (SUCTIONS) ×1
GLOVE BIO SURGEON STRL SZ7 (GLOVE) ×3 IMPLANT
GOWN STRL REUS W/TWL LRG LVL3 (GOWN DISPOSABLE) ×6 IMPLANT
KIT ABG SYR 3ML LUER SLIP (SYRINGE) IMPLANT
NDL HYPO 25X5/8 SAFETYGLIDE (NEEDLE) IMPLANT
NEEDLE HYPO 25X5/8 SAFETYGLIDE (NEEDLE) IMPLANT
NS IRRIG 1000ML POUR BTL (IV SOLUTION) ×3 IMPLANT
PACK C SECTION WH (CUSTOM PROCEDURE TRAY) ×3 IMPLANT
PAD OB MATERNITY 4.3X12.25 (PERSONAL CARE ITEMS) ×3 IMPLANT
RTRCTR C-SECT PINK 25CM LRG (MISCELLANEOUS) ×3 IMPLANT
STAPLER VISISTAT 35W (STAPLE) IMPLANT
SUT CHROMIC 1 CTX 36 (SUTURE) ×6 IMPLANT
SUT PDS AB 0 CTX 60 (SUTURE) ×3 IMPLANT
SUT VIC AB 2-0 CT1 (SUTURE) ×2 IMPLANT
SUT VIC AB 2-0 CT1 27 (SUTURE) ×3
SUT VIC AB 2-0 CT1 TAPERPNT 27 (SUTURE) ×1 IMPLANT
SUT VIC AB 4-0 KS 27 (SUTURE) IMPLANT
TOWEL OR 17X24 6PK STRL BLUE (TOWEL DISPOSABLE) ×3 IMPLANT
TRAY FOLEY CATH 14FR (SET/KITS/TRAYS/PACK) ×3 IMPLANT
WATER STERILE IRR 1000ML POUR (IV SOLUTION) ×3 IMPLANT

## 2014-05-17 NOTE — Anesthesia Postprocedure Evaluation (Signed)
  Anesthesia Post-op Note  Anesthesia Post Note  Patient: Denise BranchRachel M Ramirez-Kimrey  Procedure(s) Performed: Procedure(s) (LRB): REPEAT CESAREAN SECTION (N/A)  Anesthesia type: Spinal  Patient location: PACU  Post pain: Pain level controlled  Post assessment: Post-op Vital signs reviewed  Last Vitals:  Filed Vitals:   05/17/14 1400  BP: 113/70  Pulse: 79  Temp:   Resp: 18    Post vital signs: Reviewed  Level of consciousness: awake  Complications: No apparent anesthesia complications

## 2014-05-17 NOTE — H&P (Signed)
Denise BranchRachel M [name] is a 33 y.o. female presenting for repeat cesarean  33 yo G2P1001 @ 39+2 who presents for repeat cesarean section History OB History   Grav Para Term Preterm Abortions TAB SAB Ect Mult Living   2 1        1      Past Medical History  Diagnosis Date  . Medical history non-contributory    Past Surgical History  Procedure Laterality Date  . Cesarean section  2012  . Tonsillectomy    . Nasal septum surgery  1998  . Facial reconstruction surgery  1999   Family History: family history is not on file. Social History:  reports that she quit smoking about 8 years ago. She does not have any smokeless tobacco history on file. Her alcohol and drug histories are not on file.   Prenatal Transfer Tool  Maternal Diabetes: No Genetic Screening: Declined Maternal Ultrasounds/Referrals: Normal Fetal Ultrasounds or other Referrals:  None Maternal Substance Abuse:  No Significant Maternal Medications:  None Significant Maternal Lab Results:  None Other Comments:  None  ROS: as above    Blood pressure 117/71, pulse 99, temperature 98.2 F (36.8 C), temperature source Oral, resp. rate 18, height 5' 0.5" (1.537 m), weight 73.029 kg (161 lb), SpO2 97.00%. Exam Physical Exam  Prenatal labs: ABO, Rh: --/--/A POS, A POS (06/17 1400) Antibody: NEG (06/17 1400) Rubella: Immune (11/07 1414) RPR: NON REAC (06/17 1400)  HBsAg: Negative (11/07 1414)  HIV: Non-reactive (11/07 1414)  GBS:     Assessment/Plan: 1) admit 2) clinda/gent   Denise [name]. 05/17/2014, 11:48 AM

## 2014-05-17 NOTE — Anesthesia Procedure Notes (Signed)
Spinal  Patient location during procedure: OR Start time: 05/17/2014 12:11 PM Staffing Anesthesiologist: CASSIDY, AMY Performed by: anesthesiologist  Preanesthetic Checklist Completed: patient identified, site marked, surgical consent, pre-op evaluation, timeout performed, IV checked, risks and benefits discussed and monitors and equipment checked Spinal Block Patient position: sitting Prep: site prepped and draped and DuraPrep Patient monitoring: heart rate, cardiac monitor, continuous pulse ox and blood pressure Approach: midline Location: L3-4 Injection technique: single-shot Needle Needle type: Pencan  Needle gauge: 24 G Needle length: 9 cm Assessment Sensory level: T4 Additional Notes Clear free flow CSF on first attempt.  No paresthesia.  Patient tolerated procedure well with no apparent complications.  Jasmine DecemberA. Cassidy, MD

## 2014-05-17 NOTE — Op Note (Signed)
Pre-Operative Diagnosis: 1) 39+2 week Intrauterine pregnancy 2) History of prior cesarean section, desires repeat Postoperative Diagnosis: Same Procedure: Repeat low transverse cesarean section Surgeon: Dr. Waynard ReedsKendra Ross Assistant: Dr. Essie HartWalda Pinn Operative Findings: Vigorous female infant in vertex presentation. Apgars of 8 & 9 Specimen: Placenta for disposal EBL: Total I/O In: 2300 [I.V.:2300] Out: 1200 [Urine:400; Blood:800]   Procedure:Denise [name] is an 33 year old gravida 2 para 1001 at 7239 weeks and 2 days estimated gestational age who presents for cesarean section. Following the appropriate informed consent the patient was brought to the operating room where spinal anesthesia was administered and found to be adequate. She was placed in the dorsal supine position with a leftward tilt. She was prepped and draped in the normal sterile fashion. Scalpel was then used to make a Pfannenstiel skin incision which was carried down to the underlying layers of soft tissue to the fascia. The fascia was incised in the midline and the fascial incision was extended laterally with Mayo scissors. The superior aspect of the fascial incision was grasped with Coker clamps x2, tented up and the rectus muscles dissected off sharply with the electrocautery unit area and the same procedure was repeated on the inferior aspect of the fascial incision. The rectus muscles were separated in the midline. The abdominal peritoneum was identified, tented up, entered sharply, and the incision was extended superiorly and inferiorly with good visualization of the bladder. The Alexis retractor was then deployed. The vesicouterine peritoneum was identified, tented up, entered sharply, and the bladder flap was created digitally. Scalpel was then used to make a low transverse incision on the uterus which was extended laterally with blunt dissection. The fetal vertex was identified, delivered easily through the uterine incision followed by the  body. The infant was bulb suctioned on the operative field cried vigorously, cord was clamped and cut and the infant was passed to the waiting neonatologist. Placenta was then delivered spontaneously, the uterus was cleared of all clot and debris. The uterine incision was repaired with #1 chromic in running locked fashion followed by a second imbricating layer. Ovaries and tubes were inspected and normal. The Alexis retractor was removed. The uterus was returned to the abdominal cavity the abdominal cavity was cleared of all clot and debris. The abdominal peritoneum was reapproximated with 2-0 Vicryl in a running fashion, the rectus muscles was reapproximated with #1 chromic in a running fashion. The fascia was closed with a looped PDS in a running fashion. The skin was closed with 4-0 vicryl in a subcuticular fashion and Dermabond. All sponge lap and needle counts were correct x2. Patient tolerated the procedure well and recovered in stable condition following the procedure.

## 2014-05-17 NOTE — Anesthesia Preprocedure Evaluation (Signed)
Anesthesia Evaluation  Patient identified by MRN, date of birth, ID band Patient awake    Reviewed: Allergy & Precautions, H&P , Patient's Chart, lab work & pertinent test results  Airway Mallampati: II  TM Distance: >3 FB Neck ROM: full    Dental no notable dental hx.    Pulmonary former smoker,  breath sounds clear to auscultation  Pulmonary exam normal       Cardiovascular Exercise Tolerance: Good Rhythm:regular Rate:Normal     Neuro/Psych    GI/Hepatic   Endo/Other    Renal/GU      Musculoskeletal   Abdominal   Peds  Hematology   Anesthesia Other Findings   Reproductive/Obstetrics                             Anesthesia Physical Anesthesia Plan  ASA: II  Anesthesia Plan: Spinal   Post-op Pain Management:    Induction:   Airway Management Planned:   Additional Equipment:   Intra-op Plan:   Post-operative Plan:   Informed Consent: I have reviewed the patients History and Physical, chart, labs and discussed the procedure including the risks, benefits and alternatives for the proposed anesthesia with the patient or authorized representative who has indicated his/her understanding and acceptance.   Dental Advisory Given  Plan Discussed with: CRNA  Anesthesia Plan Comments: (Lab work confirmed with CRNA in room. Platelets okay. Discussed spinal anesthetic, and patient consents to the procedure:  included risk of possible headache,backache, failed block, allergic reaction, and nerve injury. This patient was asked if she had any questions or concerns before the procedure started. )        Anesthesia Quick Evaluation  

## 2014-05-17 NOTE — Transfer of Care (Signed)
Immediate Anesthesia Transfer of Care Note  Patient: Denise BranchRachel M Ramirez-Kimrey  Procedure(s) Performed: Procedure(s): REPEAT CESAREAN SECTION (N/A)  Patient Location: PACU  Anesthesia Type:Spinal  Level of Consciousness: awake  Airway & Oxygen Therapy: Patient Spontanous Breathing  Post-op Assessment: Report given to PACU RN  Post vital signs: Reviewed and stable  Complications: No apparent anesthesia complications

## 2014-05-18 LAB — CBC
HCT: 32.2 % — ABNORMAL LOW (ref 36.0–46.0)
HEMOGLOBIN: 10.1 g/dL — AB (ref 12.0–15.0)
MCH: 26.1 pg (ref 26.0–34.0)
MCHC: 31.4 g/dL (ref 30.0–36.0)
MCV: 83.2 fL (ref 78.0–100.0)
Platelets: 163 10*3/uL (ref 150–400)
RBC: 3.87 MIL/uL (ref 3.87–5.11)
RDW: 19.2 % — AB (ref 11.5–15.5)
WBC: 12.1 10*3/uL — AB (ref 4.0–10.5)

## 2014-05-18 LAB — BIRTH TISSUE RECOVERY COLLECTION (PLACENTA DONATION)

## 2014-05-18 NOTE — Lactation Note (Signed)
This note was copied from the chart of Denise [name]. Lactation Consultation Note  Patient Name: Denise [name] ZOXWR'UToday's Date: 05/18/2014 Reason for consult: Follow-up assessment Baby 28 hours of life. [facility]. Baby well-latch, nipple and areola deeply held in baby mouth. Baby suckling rhythmically with intermittent swallow noted. Mom states she hears baby swallowing at each feed. Mom states she nursed first child one year and this time is "even easier than the first." Enc mom to continue to feed with cues and call for assist as needed.  Maternal Data    Feeding Feeding Type: Breast Fed  LATCH Score/Interventions Latch: Grasps breast easily, tongue down, lips flanged, rhythmical sucking. (Per mom, baby latching well, baby well latched at this feeding.)  Audible Swallowing: A few with stimulation  Type of Nipple: Everted at rest and after stimulation (Per mom, no problems with nipple, either breast, denies pain.)  Comfort (Breast/Nipple): Soft / non-tender     Hold (Positioning): No assistance needed to correctly position infant at breast.  LATCH Score: 9  Lactation Tools Discussed/Used     Consult Status Consult Status: Follow-up Date: 05/19/14 Follow-up type: In-patient    Geralynn OchsWILLIARD, JENNIFER 05/18/2014, 5:31 PM

## 2014-05-18 NOTE — Progress Notes (Signed)
Patient is eating, ambulating, voiding.  Pain control is good.  Appropriate lochia, no complaints.  Filed Vitals:   05/17/14 2043 05/17/14 2250 05/18/14 0104 05/18/14 0456  BP: 112/73 115/77 100/66 109/72  Pulse: 97 97 92 96  Temp: 98.1 F (36.7 C) 98.3 F (36.8 C) 98.3 F (36.8 C) 98.2 F (36.8 C)  TempSrc: Oral     Resp: 16 16 16 16   Height:      Weight:      SpO2:  97% 98% 97%    Fundus firm Perineum without swelling. Inc: c/d/i Ext: no CT  Lab Results  Component Value Date   WBC 12.1* 05/18/2014   HGB 10.1* 05/18/2014   HCT 32.2* 05/18/2014   MCV 83.2 05/18/2014   PLT 163 05/18/2014    --/--/A POS, A POS (06/17 1400)  A/P Post op day #1 s/p c/s.  Routine care.  Expect d/c 4/21. circ desired, consent obtained.    Philip AspenALLAHAN, Yordy Matton

## 2014-05-18 NOTE — Addendum Note (Signed)
Addendum created 05/18/14 0810 by Renford DillsJanet L Kalil Woessner, CRNA   Modules edited: Notes Section   Notes Section:  File: [id number]

## 2014-05-18 NOTE — Anesthesia Postprocedure Evaluation (Signed)
  Anesthesia Post-op Note  Patient: Tonny BranchRachel M Ramirez-Kimrey  Procedure(s) Performed: Procedure(s): REPEAT CESAREAN SECTION (N/A)  Patient Location: Mother/Baby  Anesthesia Type:Spinal  Level of Consciousness: awake  Airway and Oxygen Therapy: Patient Spontanous Breathing  Post-op Pain: mild  Post-op Assessment: Post-op Vital signs reviewed, Patient's Cardiovascular Status Stable and Respiratory Function Stable  Post-op Vital Signs: stable  Last Vitals:  Filed Vitals:   05/18/14 0456  BP: 109/72  Pulse: 96  Temp: 36.8 C  Resp: 16    Complications: No apparent anesthesia complications

## 2014-05-19 ENCOUNTER — Inpatient Hospital Stay (HOSPITAL_COMMUNITY)
Admission: AD | Admit: 2014-05-19 | Discharge: 2014-05-19 | Disposition: A | Discharge status: Home / Self Care | Payer: Managed Care, Other (non HMO) | Source: Ambulatory Visit | Attending: Obstetrics and Gynecology | Admitting: Obstetrics and Gynecology

## 2014-05-19 ENCOUNTER — Encounter (HOSPITAL_COMMUNITY): Payer: Self-pay

## 2014-05-19 DIAGNOSIS — G8918 Other acute postprocedural pain: Secondary | ICD-10-CM

## 2014-05-19 DIAGNOSIS — O909 Complication of the puerperium, unspecified: Secondary | ICD-10-CM | POA: Insufficient documentation

## 2014-05-19 DIAGNOSIS — Z87891 Personal history of nicotine dependence: Secondary | ICD-10-CM | POA: Insufficient documentation

## 2014-05-19 MED ORDER — OXYCODONE-ACETAMINOPHEN 5-325 MG PO TABS
2.0000 | ORAL_TABLET | Freq: Once | ORAL | Status: AC
Start: 2014-05-19 — End: 2014-05-19
  Administered 2014-05-19: 2 via ORAL
  Filled 2014-05-19: qty 2

## 2014-05-19 MED ORDER — OXYCODONE-ACETAMINOPHEN 5-325 MG PO TABS: 1.0000 | ORAL_TABLET | ORAL | Status: AC | PRN

## 2014-05-19 NOTE — MAU Provider Note (Signed)
  History     CSN: [id number]  Arrival date and time: 05/19/14 2127   First Nakita Santerre Initiated Contact with Patient 05/19/14 2213      Chief Complaint  Patient presents with  . Post-op Problem   HPI  Pt is a 33 yo G2P2002 here postop day#2 status post a repeat csection on 05/17/14 with discharge home today.  Pt reports burning around incision site and concerned about possible opening in the incision and the sutures poking out.  Last took pain medication at 5 pm.    Past Medical History  Diagnosis Date  . Medical history non-contributory     Past Surgical History  Procedure Laterality Date  . Cesarean section  2012  . Tonsillectomy    . Nasal septum surgery  1998  . Facial reconstruction surgery  1999    History reviewed. No pertinent family history.  History  Substance Use Topics  . Smoking status: Former Smoker -- 1.00 packs/day    Quit date: 08/29/2005  . Smokeless tobacco: Not on file  . Alcohol Use: Not on file    Allergies:  Allergies  Allergen Reactions  . Penicillins Anaphylaxis and Rash  . Sulfonamide Derivatives Hives    Prescriptions prior to admission  Medication Sig Dispense Refill  . acetaminophen (TYLENOL) 500 MG tablet Take 500 mg by mouth every 6 (six) hours as needed for headache.      . flintstones complete (FLINTSTONES) 60 MG chewable tablet Chew 2 tablets by mouth daily.      Marland Kitchen. FOLIC ACID PO Take 1 tablet by mouth daily.      Marland Kitchen. oxyCODONE-acetaminophen (PERCOCET/ROXICET) 5-325 MG per tablet Take 1-2 tablets by mouth every 4 (four) hours as needed for severe pain (moderate - severe pain).  30 tablet  0    Review of Systems  Constitutional: Negative for fever and chills.  Gastrointestinal: Positive for abdominal pain (incisional pain).  All other systems reviewed and are negative.  Physical Exam   Blood pressure 128/72, pulse 101, temperature 98.4 F (36.9 C), temperature source Oral, resp. rate 16, unknown if currently  breastfeeding.  Physical Exam  Constitutional: She is oriented to person, place, and time. She appears well-developed and well-nourished.  HENT:  Head: Normocephalic.  Neck: Normal range of motion. Neck supple.  GI: Soft. There is no tenderness. There is no guarding.  Honeycomb dressing removed, darkened area on dressing that pt thought were the suture; Incision site healing well; well approximated, no signs of infection; no palpable mass.    Neurological: She is alert and oriented to person, place, and time. She has normal reflexes.  Skin: Skin is warm and dry.   2250 Consulted with Dr. Teressa Lowerallahan>reveiwed HPI/exam>discharge to home MAU Course  Procedures  New honeycomb dressing applied. Order for percocet 5/325 (ii)  Assessment and Plan  Postop Incision Pain  Plan: Discharge to home Explained importance of taking pain medication as prescribed Provided reassurance Follow-up in office as scheduled  [facility] LPMUHAMMAD,WALIDAH 05/19/2014, 10:13 PM

## 2014-05-19 NOTE — Discharge Summary (Signed)
Obstetric Discharge Summary Reason for Admission: cesarean section Prenatal Procedures: ultrasound Intrapartum Procedures: cesarean: low cervical, transverse Postpartum Procedures: none Complications-Operative and Postpartum: none Hemoglobin  Date Value Ref Range Status  05/18/2014 10.1* 12.0 - 15.0 g/dL Final     HCT  Date Value Ref Range Status  05/18/2014 32.2* 36.0 - 46.0 % Final    Physical Exam:  General: alert and cooperative Lochia: appropriate Uterine Fundus: firm Incision: healing well, no significant drainage, no significant erythema DVT Evaluation: No evidence of DVT seen on physical exam.  Discharge Diagnoses: Term Pregnancy-delivered  Discharge Information: Date: 05/19/2014 Activity: pelvic rest Diet: routine Medications: None, Ibuprofen and Percocet Condition: stable Instructions: refer to practice specific booklet Discharge to: home Follow-up Information   Follow up with Almon HerculesOSS,KENDRA H., MD In 2 weeks.   Specialty:  Obstetrics and Gynecology   Contact information:   [address] [place] [address] [phone]       Newborn Data: Live born female  Birth Weight: 8 lb 3.2 oz (3720 g) APGAR: 8, 9  Home with mother.  Denise [name], Denise [name] 05/19/2014, 7:16 AM

## 2014-05-19 NOTE — Lactation Note (Signed)
This note was copied from the chart of Boy Mariann BarterRachel Ramirez-Kimrey. Lactation Consultation Note  Patient Name: Boy Mariann BarterRachel Ramirez-Kimrey ZOXWR'UToday's Date: 05/19/2014 Reason for consult: Follow-up assessment Mom had baby latched to right breast in cradle hold when I arrived, however baby did not appear to have much depth. LC assisted Mom with cross cradle hold, support for Mom and baby. Baby appeared to have more depth, lips well flanged. Mom denies any discomfort. Reviewed with Mom the importance of deep latch to milk transfer and to prevent sore nipples. Reviewed with Mom how to assess for deep latch. Engorgement care reviewed, advised of OP services and support group. Mom denies other questions or concerns.   Maternal Data    Feeding Feeding Type: Breast Fed Length of feed: 15 min  LATCH Score/Interventions Latch: Grasps breast easily, tongue down, lips flanged, rhythmical sucking.  Audible Swallowing: A few with stimulation  Type of Nipple: Everted at rest and after stimulation  Comfort (Breast/Nipple): Soft / non-tender     Hold (Positioning): Assistance needed to correctly position infant at breast and maintain latch. Intervention(s): Breastfeeding basics reviewed;Support Pillows;Position options;Skin to skin  LATCH Score: 8  Lactation Tools Discussed/Used     Consult Status Consult Status: Complete Date: 05/19/14 Follow-up type: In-patient    Alfred LevinsGranger, Kathy Ann 05/19/2014, 9:18 AM

## 2014-05-19 NOTE — MAU Note (Signed)
Pt felt like something was coming out. Felt "pulling." Noticed sutures through the honeycomb dressing. Pt called Dr who told her to come in.

## 2014-05-20 ENCOUNTER — Encounter (HOSPITAL_COMMUNITY): Payer: Self-pay | Admitting: Obstetrics and Gynecology

## 2014-05-27 NOTE — Addendum Note (Signed)
Addendum created 05/27/14 0659 by Cristela BlueKyle Jackson, MD   Modules edited: Anesthesia Events

## 2014-09-30 ENCOUNTER — Encounter (HOSPITAL_COMMUNITY): Payer: Self-pay | Admitting: Obstetrics and Gynecology

## 2016-09-01 ENCOUNTER — Other Ambulatory Visit: Payer: Self-pay | Admitting: Obstetrics & Gynecology

## 2016-09-02 LAB — CYTOLOGY - PAP
